# Patient Record
Sex: Male | Born: 2005 | Hispanic: Yes | Marital: Single | Smoking: Never smoker
Health system: Southern US, Community
[De-identification: ages and names within clinical notes are randomized; demographics above are authoritative.]

## PROBLEM LIST (undated history)

## (undated) HISTORY — PX: MOUTH SURGERY: SHX715

---
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
## 2024-02-07 ENCOUNTER — Encounter: Payer: Self-pay | Admitting: Internal Medicine

## 2024-02-07 ENCOUNTER — Ambulatory Visit (INDEPENDENT_AMBULATORY_CARE_PROVIDER_SITE_OTHER): Payer: Self-pay | Admitting: Internal Medicine

## 2024-02-07 VITALS — BP 130/82 | Ht 69.0 in | Wt 181.2 lb

## 2024-02-07 DIAGNOSIS — Z6826 Body mass index (BMI) 26.0-26.9, adult: Secondary | ICD-10-CM | POA: Diagnosis not present

## 2024-02-07 DIAGNOSIS — E663 Overweight: Secondary | ICD-10-CM | POA: Diagnosis not present

## 2024-02-07 NOTE — Progress Notes (Signed)
 Subjective:    Patient ID: Ryan Fuentes, male    DOB: [birth year], 18 y.o.   MRN: [id number]  HPI  Patient presents to the clinic today for his annual exam.  Flu: never Tetanus: 02/2017 COVID: X 2 Dentist: as needed  Diet: He does eat meat. He consumes fruits and veggies. He does eat fried foods. He drinks mostly water. Exercise: Gym 5 days per week   Review of Systems   No past medical history on file.  Current Outpatient Medications  Medication Sig Dispense Refill   Multiple Vitamin (MULTIVITAMIN) capsule Take 1 capsule by mouth daily.     No current facility-administered medications for this visit.    No Known Allergies  Family History  Problem Relation Age of Onset   Healthy Mother    Healthy Father    Healthy Sister    Healthy Brother    Healthy Brother     Social History   Socioeconomic History   Marital status: Single    Spouse name: Not on file   Number of children: Not on file   Years of education: Not on file   Highest education level: Not on file  Occupational History   Not on file  Tobacco Use   Smoking status: Never   Smokeless tobacco: Never  Vaping Use   Vaping status: Never Used  Substance and Sexual Activity   Alcohol use: No   Drug use: Never   Sexual activity: Not on file  Other Topics Concern   Not on file  Social History Narrative   Not on file   Social Drivers of Health   Financial Resource Strain: Not on file  Food Insecurity: Not on file  Transportation Needs: Not on file  Physical Activity: Not on file  Stress: Not on file  Social Connections: Not on file  Intimate Partner Violence: Not on file     Constitutional: Denies fever, malaise, fatigue, headache or abrupt weight changes.  HEENT: Denies eye pain, eye redness, ear pain, ringing in the ears, wax buildup, runny nose, nasal congestion, bloody nose, or sore throat. Respiratory: Denies difficulty breathing, shortness of breath, cough or sputum production.    Cardiovascular: Denies chest pain, chest tightness, palpitations or swelling in the hands or feet.  Gastrointestinal: Denies abdominal pain, bloating, constipation, diarrhea or blood in the stool.  GU: Denies urgency, frequency, pain with urination, burning sensation, blood in urine, odor or discharge. Musculoskeletal: Denies decrease in range of motion, difficulty with gait, muscle pain or joint pain and swelling.  Skin: Pt reports rash to BLE. Denies ulcerations.  Neurological: Denies dizziness, difficulty with memory, difficulty with speech or problems with balance and coordination.  Psych: Patient reports situational anxiety.  Denies depression, SI/HI.  No other specific complaints in a complete review of systems (except as listed in HPI above).      Objective:   Physical Exam  BP 130/82 (BP Location: Left Arm, Patient Position: Sitting, Cuff Size: Normal)   Ht 5' 9 (1.753 m)   Wt 181 lb 3.2 oz (82.2 kg)   BMI 26.76 kg/m   Wt Readings from Last 3 Encounters:  05/13/23 170 lb (77.1 kg) (75%, Z= 0.68)*  02/08/23 163 lb (73.9 kg) (68%, Z= 0.48)*  04/03/22 177 lb 0.5 oz (80.3 kg) (86%, Z= 1.06)*   * Growth percentiles are based on CDC (Boys, 2-20 Years) data.    General: Appears his stated age, overweight in NAD. Skin: Warm, dry and intact.  Multiple, hyperpigmented, scabbed  lesions noted of BLE. HEENT: Head: normal shape and size; Eyes: sclera white, no icterus, conjunctiva pink, PERRLA and EOMs intact;  Neck:  Neck supple, trachea midline. No masses, lumps or thyromegaly present.  Cardiovascular: Normal rate and rhythm. S1,S2 noted.  No murmur, rubs or gallops noted. No JVD or BLE edema.  Pulmonary/Chest: Normal effort and positive vesicular breath sounds. No respiratory distress. No wheezes, rales or ronchi noted.  Abdomen: Soft and nontender. Normal bowel sounds.  Musculoskeletal: Strength 5/5 BUE/BLE.  No difficulty with gait.  Neurological: Alert and oriented. Cranial  nerves II-XII grossly intact. Coordination normal.  Psychiatric: Mood and affect normal. Behavior is normal. Judgment and thought content normal.    BMET    Component Value Date/Time   NA 139 04/03/2022 1459   K 4.0 04/03/2022 1459   CL 108 04/03/2022 1459   CO2 25 04/03/2022 1459   GLUCOSE 96 04/03/2022 1459   BUN 12 04/03/2022 1459   CREATININE 0.90 04/03/2022 1459   CALCIUM 9.2 04/03/2022 1459   GFRNONAA NOT CALCULATED 04/03/2022 1459    Lipid Panel  No results found for: CHOL, TRIG, HDL, CHOLHDL, VLDL, LDLCALC  CBC    Component Value Date/Time   WBC 6.6 04/03/2022 1459   RBC 4.67 04/03/2022 1459   HGB 12.7 04/03/2022 1459   HCT 39.5 04/03/2022 1459   PLT 248 04/03/2022 1459   MCV 84.6 04/03/2022 1459   MCH 27.2 04/03/2022 1459   MCHC 32.2 04/03/2022 1459   RDW 12.3 04/03/2022 1459    Hgb A1C No results found for: HGBA1C          Assessment & Plan:   Preventative health maintenance:  Encouraged him to get a flu shot in the fall Tetanus UTD Encouraged him to get his COVID booster Encouraged him to consume a balanced diet and exercise regimen Advised him to see a dentist annually He declines lab work today  RTC in 1 year for your annual exam Angeline Laura, NP

## 2024-02-07 NOTE — Assessment & Plan Note (Signed)
 Encouraged diet and exercise for weight loss ?

## 2024-02-07 NOTE — Patient Instructions (Signed)
 Health Maintenance, Male  Adopting a healthy lifestyle and getting preventive care are important in promoting health and wellness. Ask your health care provider about:  The right schedule for you to have regular tests and exams.  Things you can do on your own to prevent diseases and keep yourself healthy.  What should I know about diet, weight, and exercise?  Eat a healthy diet    Eat a diet that includes plenty of vegetables, fruits, low-fat dairy products, and lean protein.  Do not eat a lot of foods that are high in solid fats, added sugars, or sodium.  Maintain a healthy weight  Body mass index (BMI) is a measurement that can be used to identify possible weight problems. It estimates body fat based on height and weight. Your health care provider can help determine your BMI and help you achieve or maintain a healthy weight.  Get regular exercise  Get regular exercise. This is one of the most important things you can do for your health. Most adults should:  Exercise for at least 150 minutes each week. The exercise should increase your heart rate and make you sweat (moderate-intensity exercise).  Do strengthening exercises at least twice a week. This is in addition to the moderate-intensity exercise.  Spend less time sitting. Even light physical activity can be beneficial.  Watch cholesterol and blood lipids  Have your blood tested for lipids and cholesterol at 19 years of age, then have this test every 5 years.  You may need to have your cholesterol levels checked more often if:  Your lipid or cholesterol levels are high.  You are older than 19 years of age.  You are at high risk for heart disease.  What should I know about cancer screening?  Many types of cancers can be detected early and may often be prevented. Depending on your health history and family history, you may need to have cancer screening at various ages. This may include screening for:  Colorectal cancer.  Prostate cancer.  Skin cancer.  Lung  cancer.  What should I know about heart disease, diabetes, and high blood pressure?  Blood pressure and heart disease  High blood pressure causes heart disease and increases the risk of stroke. This is more likely to develop in people who have high blood pressure readings or are overweight.  Talk with your health care provider about your target blood pressure readings.  Have your blood pressure checked:  Every 3-5 years if you are 9-95 years of age.  Every year if you are 85 years old or older.  If you are between the ages of 29 and 29 and are a current or former smoker, ask your health care provider if you should have a one-time screening for abdominal aortic aneurysm (AAA).  Diabetes  Have regular diabetes screenings. This checks your fasting blood sugar level. Have the screening done:  Once every three years after age 23 if you are at a normal weight and have a low risk for diabetes.  More often and at a younger age if you are overweight or have a high risk for diabetes.  What should I know about preventing infection?  Hepatitis B  If you have a higher risk for hepatitis B, you should be screened for this virus. Talk with your health care provider to find out if you are at risk for hepatitis B infection.  Hepatitis C  Blood testing is recommended for:  Everyone born from [birth year] through 1966.  Anyone  with known risk factors for hepatitis C.  Sexually transmitted infections (STIs)  You should be screened each year for STIs, including gonorrhea and chlamydia, if:  You are sexually active and are younger than 19 years of age.  You are older than 19 years of age and your health care provider tells you that you are at risk for this type of infection.  Your sexual activity has changed since you were last screened, and you are at increased risk for chlamydia or gonorrhea. Ask your health care provider if you are at risk.  Ask your health care provider about whether you are at high risk for HIV. Your health care provider  may recommend a prescription medicine to help prevent HIV infection. If you choose to take medicine to prevent HIV, you should first get tested for HIV. You should then be tested every 3 months for as long as you are taking the medicine.  Follow these instructions at home:  Alcohol use  Do not drink alcohol if your health care provider tells you not to drink.  If you drink alcohol:  Limit how much you have to 0-2 drinks a day.  Know how much alcohol is in your drink. In the [place] one drink equals one 12 oz bottle of beer (355 mL), one 5 oz glass of wine (148 mL), or one 1 oz glass of hard liquor (44 mL).  Lifestyle  Do not use any products that contain nicotine or tobacco. These products include cigarettes, chewing tobacco, and vaping devices, such as e-cigarettes. If you need help quitting, ask your health care provider.  Do not use street drugs.  Do not share needles.  Ask your health care provider for help if you need support or information about quitting drugs.  General instructions  Schedule regular health, dental, and eye exams.  Stay current with your vaccines.  Tell your health care provider if:  You often feel depressed.  You have ever been abused or do not feel safe at home.  Summary  Adopting a healthy lifestyle and getting preventive care are important in promoting health and wellness.  Follow your health care provider's instructions about healthy diet, exercising, and getting tested or screened for diseases.  Follow your health care provider's instructions on monitoring your cholesterol and blood pressure.  This information is not intended to replace advice given to you by your health care provider. Make sure you discuss any questions you have with your health care provider.  Document Revised: 11/10/2020 Document Reviewed: 11/10/2020  [employer]vier Patient Education  2024 [employer].

## 2025-02-12 ENCOUNTER — Encounter: Admitting: Internal Medicine
